# Patient Record
Sex: Male | Born: 1990 | Race: White | Hispanic: No | Marital: Single | Smoking: Current every day smoker
Health system: Southern US, Community
[De-identification: ages and names within clinical notes are randomized; demographics above are authoritative.]

## PROBLEM LIST (undated history)

## (undated) DIAGNOSIS — I10 Essential (primary) hypertension: Secondary | ICD-10-CM

## (undated) DIAGNOSIS — N200 Calculus of kidney: Secondary | ICD-10-CM

## (undated) HISTORY — PX: TONSILLECTOMY: SUR1361

---
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
## 2012-01-25 ENCOUNTER — Emergency Department (HOSPITAL_COMMUNITY)
Admission: EM | Admit: 2012-01-25 | Discharge: 2012-01-25 | Disposition: A | Discharge status: Home / Self Care | Payer: Commercial Managed Care - PPO | Attending: Emergency Medicine | Admitting: Emergency Medicine

## 2012-01-25 ENCOUNTER — Encounter (HOSPITAL_COMMUNITY): Payer: Self-pay | Admitting: Physical Medicine and Rehabilitation

## 2012-01-25 DIAGNOSIS — N200 Calculus of kidney: Secondary | ICD-10-CM

## 2012-01-25 HISTORY — DX: Calculus of kidney: N20.0

## 2012-01-25 LAB — URINALYSIS, ROUTINE W REFLEX MICROSCOPIC
Bilirubin Urine: NEGATIVE
Glucose, UA: NEGATIVE mg/dL
Specific Gravity, Urine: 1.025 (ref 1.005–1.030)

## 2012-01-25 LAB — BASIC METABOLIC PANEL
BUN: 12 mg/dL (ref 6–23)
Calcium: 10.4 mg/dL (ref 8.4–10.5)
GFR calc Af Amer: >90 mL/min (ref >90)
GFR calc non Af Amer: >90 mL/min (ref >90)
Potassium: 3.8 mEq/L (ref 3.5–5.1)
Sodium: 139 mEq/L (ref 135–145)

## 2012-01-25 LAB — CBC
HCT: 47.4 % (ref 39.0–52.0)
MCH: 29.7 pg (ref 26.0–34.0)
MCHC: 35.4 g/dL (ref 30.0–36.0)
RDW: 12.8 % (ref 11.5–15.5)

## 2012-01-25 LAB — URINE MICROSCOPIC-ADD ON

## 2012-01-25 MED ORDER — ONDANSETRON HCL 4 MG PO TABS: 4.0000 mg | ORAL_TABLET | Freq: Four times a day (QID) | ORAL | Status: AC | PRN

## 2012-01-25 MED ORDER — IBUPROFEN 800 MG PO TABS: 800.0000 mg | ORAL_TABLET | Freq: Three times a day (TID) | ORAL | Status: AC

## 2012-01-25 MED ORDER — KETOROLAC TROMETHAMINE 30 MG/ML IJ SOLN
30.0000 mg | Freq: Once | INTRAMUSCULAR | Status: AC
  Administered 2012-01-25: 30 mg via INTRAVENOUS
  Filled 2012-01-25: qty 1

## 2012-01-25 MED ORDER — SODIUM CHLORIDE 0.9 % IV BOLUS (SEPSIS)
1000.0000 mL | Freq: Once | INTRAVENOUS | Status: AC
  Administered 2012-01-25: 1000 mL via INTRAVENOUS

## 2012-01-25 MED ORDER — ONDANSETRON HCL 4 MG/2ML IJ SOLN
4.0000 mg | Freq: Once | INTRAMUSCULAR | Status: AC
  Administered 2012-01-25: 4 mg via INTRAVENOUS
  Filled 2012-01-25: qty 2

## 2012-01-25 MED ORDER — OXYCODONE-ACETAMINOPHEN 5-325 MG PO TABS: 1.0000 | ORAL_TABLET | Freq: Four times a day (QID) | ORAL | Status: AC | PRN

## 2012-01-25 MED ORDER — ONDANSETRON 4 MG PO TBDP
ORAL_TABLET | ORAL | Status: AC
  Administered 2012-01-25: 8 mg via ORAL
  Filled 2012-01-25: qty 2

## 2012-01-25 MED ORDER — HYDROMORPHONE HCL PF 1 MG/ML IJ SOLN
1.0000 mg | Freq: Once | INTRAMUSCULAR | Status: AC
  Administered 2012-01-25: 1 mg via INTRAVENOUS
  Filled 2012-01-25: qty 1

## 2012-01-25 MED ORDER — TAMSULOSIN HCL 0.4 MG PO CAPS: 0.4000 mg | ORAL_CAPSULE | Freq: Every day | ORAL | Status: DC

## 2012-01-25 MED ORDER — ONDANSETRON 4 MG PO TBDP
8.0000 mg | ORAL_TABLET | Freq: Once | ORAL | Status: AC
  Administered 2012-01-25: 8 mg via ORAL

## 2012-01-25 NOTE — Discharge Instructions (Signed)
As we discussed, your labs showed no problem with kidney function and no elevated white blood cell count. Your urine showed blood and some dehydration without evidence of a urinary infection. Please take the prescriptions as discussed/directed.     Kidney Stones Kidney stones (ureteral lithiasis) are deposits that form inside your kidneys. The intense pain is caused by the stone moving through the urinary tract. When the stone moves, the ureter goes into spasm around the stone. The stone is usually passed in the urine.  CAUSES   A disorder that makes certain neck glands produce too much parathyroid hormone (primary hyperparathyroidism).   A buildup of uric acid crystals.   Narrowing (stricture) of the ureter.   A kidney obstruction present at birth (congenital obstruction).   Previous surgery on the kidney or ureters.   Numerous kidney infections.  SYMPTOMS   Feeling sick to your stomach (nauseous).   Throwing up (vomiting).   Blood in the urine (hematuria).   Pain that usually spreads (radiates) to the groin.   Frequency or urgency of urination.  DIAGNOSIS   Taking a history and physical exam.   Blood or urine tests.   Computerized X-ray scan (CT scan).   Occasionally, an examination of the inside of the urinary bladder (cystoscopy) is performed.  TREATMENT   Observation.   Increasing your fluid intake.   Surgery may be needed if you have severe pain or persistent obstruction.  The size, location, and chemical composition are all important variables that will determine the proper choice of action for you. Talk to your caregiver to better understand your situation so that you will minimize the risk of injury to yourself and your kidney.  HOME CARE INSTRUCTIONS   Drink enough water and fluids to keep your urine clear or pale yellow.   Strain all urine through the provided strainer. Keep all particulate matter and stones for your caregiver to see. The stone causing  the pain may be as small as a grain of salt. It is very important to use the strainer each and every time you pass your urine. The collection of your stone will allow your caregiver to analyze it and verify that a stone has actually passed.   Only take over-the-counter or prescription medicines for pain, discomfort, or fever as directed by your caregiver.   Make a follow-up appointment with your caregiver as directed.   Get follow-up X-rays if required. The absence of pain does not always mean that the stone has passed. It may have only stopped moving. If the urine remains completely obstructed, it can cause loss of kidney function or even complete destruction of the kidney. It is your responsibility to make sure X-rays and follow-ups are completed. Ultrasounds of the kidney can show blockages and the status of the kidney. Ultrasounds are not associated with any radiation and can be performed easily in a matter of minutes.  SEEK IMMEDIATE MEDICAL CARE IF:   Pain cannot be controlled with the prescribed medicine.   You have a fever.   The severity or intensity of pain increases over 18 hours and is not relieved by pain medicine.   You develop a new onset of abdominal pain.   You feel faint or pass out.  MAKE SURE YOU:   Understand these instructions.   Will watch your condition.   Will get help right away if you are not doing well or get worse.  Document Released: 07/29/2005 Document Revised: 07/18/2011 Document Reviewed: 11/24/2009 ExitCare Patient Information  [address] [place].

## 2012-01-25 NOTE — ED Notes (Signed)
Called lab and they will run BMP STAT

## 2012-01-25 NOTE — ED Provider Notes (Signed)
Medical screening examination/treatment/procedure(s) were performed by non-physician practitioner and as supervising physician I was immediately available for consultation/collaboration.  Ethelda Chick, MD 01/25/12 [phone]

## 2012-01-25 NOTE — ED Notes (Signed)
Spoke with minilab, blood for istat chem 8 was accidentally sent upstairs with other lab work. PA updated and pt and mother at bedside updated on delay. PA will order BMP for main lab.

## 2012-01-25 NOTE — ED Notes (Signed)
Pt presents to department for evaluation of L sided flank pain, onset this morning. Also states dysuria and dark colored urine. 8/10 pain. Also states nausea/vomiting. He is alert and oriented x4. States history of kidney stones.

## 2012-01-25 NOTE — ED Provider Notes (Signed)
History     CSN: [id number]  Arrival date & time 01/25/12  1208   First MD Initiated Contact with Patient 01/25/12 1230      Chief Complaint  Patient presents with  . Flank Pain  . Dysuria    (Consider location/radiation/quality/duration/timing/severity/associated sxs/prior treatment) The history is provided by the patient and a parent.   the patient is a 21 year old male with a known history of kidney stones as diagnosed on CT scan approximately one month ago per his mother, who presents the emergency department with a chief complaint of left flank pain gradually worsening for the last 2 days. Pain colicky, severe, radiates to left lower abdomen in the left groin. Associated with nausea and vomiting as well as decreased urine output/difficulty urinating today. + gross hematuria, mild dysuria. He denies any fever, but does endorse chills and sweats. No aggravating or alleviating factors. Has no remaining pain medication at home from his prior evaluation. Per mother, patient was initially seen by urologist in [place] but was unhappy with care and is currently obtaining followup with a urologist at [facility]- confident an appt can be scheduled this week.  Past Medical History  Diagnosis Date  . Kidney stones     No past surgical history on file.  History reviewed. No pertinent family history.  History  Substance Use Topics  . Smoking status: Never Smoker   . Smokeless tobacco: Not on file  . Alcohol Use: No      Review of Systems 10 systems reviewed and are negative for acute change except as noted in the HPI.  Allergies  Review of patient's allergies indicates no known allergies.  Home Medications  No current outpatient prescriptions on file.  BP 146/93  Pulse 84  Temp 98.7 F (37.1 C) (Oral)  Resp 18  SpO2 100%  Physical Exam  Constitutional: He appears well-developed and well-nourished.       VS reviewed, sig for slight HTN. Lying on stretcher, uncomfortable  appearing  HENT:  Head: Normocephalic and atraumatic.       MMM  Eyes: Pupils are equal, round, and reactive to light.  Neck: Neck supple.  Cardiovascular: Normal rate, regular rhythm and normal heart sounds.   Pulmonary/Chest: Effort normal and breath sounds normal. No respiratory distress. He has no wheezes. He exhibits no tenderness.  Abdominal: Soft. Bowel sounds are normal. He exhibits no distension. There is tenderness (mild LLQ, suprapubic).  Musculoskeletal: He exhibits no edema.  Neurological: He is alert.       Speech clear. MAEW.  Skin: Skin is warm and dry. There is pallor.    ED Course  Procedures (including critical care time)  Labs Reviewed  URINALYSIS, ROUTINE W REFLEX MICROSCOPIC - Abnormal; Notable for the following:    APPearance CLOUDY (*)     Hgb urine dipstick MODERATE (*)     Ketones, ur 15 (*)     Protein, ur 100 (*)     Leukocytes, UA TRACE (*)     All other components within normal limits  URINE MICROSCOPIC-ADD ON - Abnormal; Notable for the following:    Squamous Epithelial / LPF FEW (*)     All other components within normal limits  CBC   No results found.   1. Kidney stones       MDM  1:15 PM Pt seen and evaluated. Initial H&P completed. Given recent CT scan with known multiple stones, we will assess renal function, hydrate, and treat pain with plan to d/c home  with urology follow-up.      3:43 PM Improved pain, preserved renal function. No leukocytosis or evidence of UTI. VSS. Urinated without trouble in ED. Will d./c home with pain/nausea rx and flomax, agrees to f/u ASAP with urology at [facility].  Shaaron Adler, PA-C 01/25/12 1547

## 2012-01-25 NOTE — ED Notes (Signed)
Pt and mother aware BMP has resulted, PA notified

## 2018-12-15 ENCOUNTER — Other Ambulatory Visit: Payer: Self-pay

## 2018-12-15 ENCOUNTER — Emergency Department (HOSPITAL_COMMUNITY)
Admission: EM | Admit: 2018-12-15 | Discharge: 2018-12-15 | Disposition: A | Discharge status: Home / Self Care | Payer: 59 | Attending: Emergency Medicine | Admitting: Emergency Medicine

## 2018-12-15 ENCOUNTER — Emergency Department (HOSPITAL_COMMUNITY): Payer: 59

## 2018-12-15 DIAGNOSIS — R0602 Shortness of breath: Secondary | ICD-10-CM | POA: Insufficient documentation

## 2018-12-15 DIAGNOSIS — R0789 Other chest pain: Secondary | ICD-10-CM | POA: Diagnosis present

## 2018-12-15 LAB — CBC
HCT: 45 % (ref 39.0–52.0)
Hemoglobin: 15.1 g/dL (ref 13.0–17.0)
MCH: 30 pg (ref 26.0–34.0)
MCHC: 33.6 g/dL (ref 30.0–36.0)
MCV: 89.5 fL (ref 80.0–100.0)
Platelets: 284 10*3/uL (ref 150–400)
RBC: 5.03 MIL/uL (ref 4.22–5.81)
RDW: 12.8 % (ref 11.5–15.5)
WBC: 12.6 10*3/uL — ABNORMAL HIGH (ref 4.0–10.5)
nRBC: 0 % (ref 0.0–0.2)

## 2018-12-15 LAB — TROPONIN I
Troponin I: <0.03 ng/mL (ref <0.03)
Troponin I: <0.03 ng/mL (ref <0.03)

## 2018-12-15 LAB — BASIC METABOLIC PANEL
Anion gap: 12 (ref 5–15)
BUN: 15 mg/dL (ref 6–20)
CO2: 22 mmol/L (ref 22–32)
Calcium: 9.5 mg/dL (ref 8.9–10.3)
Chloride: 103 mmol/L (ref 98–111)
Creatinine, Ser: 1.22 mg/dL (ref 0.61–1.24)
GFR calc Af Amer: >60 mL/min (ref >60)
GFR calc non Af Amer: >60 mL/min (ref >60)
Glucose, Bld: 160 mg/dL — ABNORMAL HIGH (ref 70–99)
Potassium: 3.6 mmol/L (ref 3.5–5.1)
Sodium: 137 mmol/L (ref 135–145)

## 2018-12-15 LAB — D-DIMER, QUANTITATIVE: D-Dimer, Quant: <0.27 ug/mL-FEU (ref 0.00–0.50)

## 2018-12-15 MED ORDER — SODIUM CHLORIDE 0.9% FLUSH: 3.0000 mL | Freq: Once | INTRAVENOUS | Status: DC

## 2018-12-15 NOTE — ED Notes (Signed)
Pt discharged from ED; instructions provided and scripts given; Pt encouraged to return to ED if symptoms worsen and to f/u with PCP; Pt verbalized understanding of all instructions 

## 2018-12-15 NOTE — ED Triage Notes (Signed)
Per pt he has been having chest pain with sob for about 1 week. NO fevers, no chills. Pt has had no vomiting, does not radiate anywhere.

## 2018-12-15 NOTE — ED Provider Notes (Signed)
[facility] EMERGENCY DEPARTMENT Provider Note   CSN: [id number] Arrival date & time: 12/15/18  0220    History   Chief Complaint Chief Complaint  Patient presents with   Chest Pain   Shortness of Breath    HPI Randy Norris is a 28 y.o. male.     Patient presents with left-sided chest pain that has been constant for about the past 1 hour.  States this is been constant and came on while he was at home playing video games.  The pain is in his left upper chest feels more like a tightness rather than actual pain.  Does radiate to his left arm.  There is some associated shortness of breath.  No nausea, vomiting, fever, chills, diaphoresis.  Patient reports having this pain intermittently over the past 2 weeks happening about once a day lasting for 30 to 60 minutes at a time.  He has not had the pain evaluated before tonight.  Reports this is the worst episode he had.  He did not take any for it at home.  Denies any cardiac history or early history of cardiac disease in his family.  Denies taking any medications on a regular basis.  No history of alcohol or drug abuse.  No cocaine abuse.  Patient does state he has a history of generalized anxiety disorder and panic attacks but does not take any medication for this.  He is feeling better since arrival and his chest pain is resolving.  It is not worse with palpation or movement.   Chest Pain  Associated symptoms: shortness of breath   Associated symptoms: no abdominal pain, no dizziness, no fever, no headache, no nausea, no vomiting and no weakness   Shortness of Breath  Associated symptoms: chest pain   Associated symptoms: no abdominal pain, no fever, no headaches, no rash and no vomiting     Past Medical History:  Diagnosis Date   Kidney stones     Patient Active Problem List   Diagnosis Date Noted   ANKLE SPRAIN 12/28/2007   CONJUNCTIVITIS 06/24/2007   BRONCHITIS, ACUTE 05/06/2007   ELEVATED BLOOD  PRESSURE 04/29/2007    * The histories are not reviewed yet. Please review them in the "History" navigator section and refresh this SmartLink.      Home Medications    Prior to Admission medications   Medication Sig Start Date End Date Taking? Authorizing Provider  Tamsulosin HCl (FLOMAX) 0.4 MG CAPS Take 1 capsule (0.4 mg total) by mouth daily. Patient not taking: Reported on 12/15/2018 01/25/12   Lorenz Coaster, PA-C    Family History No family history on file.  Social History Social History   Tobacco Use   Smoking status: Never Smoker  Substance Use Topics   Alcohol use: No   Drug use: No     Allergies   Patient has no known allergies.   Review of Systems Review of Systems  Constitutional: Negative for activity change, appetite change and fever.  HENT: Negative for congestion and rhinorrhea.   Eyes: Negative for visual disturbance.  Respiratory: Positive for shortness of breath.   Cardiovascular: Positive for chest pain.  Gastrointestinal: Negative for abdominal pain, nausea and vomiting.  Genitourinary: Negative for dysuria and hematuria.  Musculoskeletal: Negative for arthralgias and myalgias.  Skin: Negative for rash.  Neurological: Negative for dizziness, weakness and headaches.    all other systems are negative except as noted in the HPI and PMH.    Physical Exam Updated  Vital Signs BP (!) 162/89 (BP Location: Right Arm)    Pulse 92    Temp 97.7 F (36.5 C) (Oral)    Resp 16    SpO2 100%   Physical Exam Vitals signs and nursing note reviewed.  Constitutional:      General: He is not in acute distress.    Appearance: He is well-developed.     Comments: Appears anxious, no distress  HENT:     Head: Normocephalic and atraumatic.     Mouth/Throat:     Pharynx: No oropharyngeal exudate.  Eyes:     Conjunctiva/sclera: Conjunctivae normal.     Pupils: Pupils are equal, round, and reactive to light.  Neck:     Musculoskeletal: Normal range of  motion and neck supple.     Comments: No meningismus. Cardiovascular:     Rate and Rhythm: Normal rate and regular rhythm.     Heart sounds: Normal heart sounds. No murmur.  Pulmonary:     Effort: Pulmonary effort is normal. No respiratory distress.     Breath sounds: Normal breath sounds.     Comments: Chest wall is nontender to palpation.  No pain with left arm movement Chest:     Chest wall: No tenderness.  Abdominal:     Palpations: Abdomen is soft.     Tenderness: There is no abdominal tenderness. There is no guarding or rebound.  Musculoskeletal: Normal range of motion.        General: No tenderness.  Skin:    General: Skin is warm.     Capillary Refill: Capillary refill takes less than 2 seconds.  Neurological:     General: No focal deficit present.     Mental Status: He is alert and oriented to person, place, and time. Mental status is at baseline.     Cranial Nerves: No cranial nerve deficit.     Motor: No abnormal muscle tone.     Coordination: Coordination normal.     Comments: No ataxia on finger to nose bilaterally. No pronator drift. 5/5 strength throughout. CN 2-12 intact.Equal grip strength. Sensation intact.   Psychiatric:        Behavior: Behavior normal.      ED Treatments / Results  Labs (all labs ordered are listed, but only abnormal results are displayed) Labs Reviewed  BASIC METABOLIC PANEL - Abnormal; Notable for the following components:      Result Value   Glucose, Bld 160 (*)    All other components within normal limits  CBC - Abnormal; Notable for the following components:   WBC 12.6 (*)    All other components within normal limits  TROPONIN I  D-DIMER, QUANTITATIVE (NOT AT [facility])  TROPONIN I    EKG EKG Interpretation  Date/Time:  Tuesday Dec 15 2018 02:28:55 EDT Ventricular Rate:  90 PR Interval:  130 QRS Duration: 94 QT Interval:  344 QTC Calculation: 420 R Axis:   67 Text Interpretation:  Normal sinus rhythm with sinus arrhythmia  Normal ECG No previous ECGs available Confirmed by Glynn Octave [id number]) on 12/15/2018 2:49:16 AM   Radiology Dg Chest 2 View  Result Date: 12/15/2018 CLINICAL DATA:  Chest pain, shortness of breath EXAM: CHEST - 2 VIEW COMPARISON:  None. FINDINGS: Heart and mediastinal contours are within normal limits. No focal opacities or effusions. No acute bony abnormality. IMPRESSION: No active cardiopulmonary disease. Electronically Signed   By: Charlett Nose M.D.   On: 12/15/2018 02:51    Procedures Procedures (including critical  care time)  Medications Ordered in ED Medications  sodium chloride flush (NS) 0.9 % injection 3 mL (has no administration in time range)     Initial Impression / Assessment and Plan / ED Course  I have reviewed the triage vital signs and the nursing notes.  Pertinent labs & imaging results that were available during my care of the patient were reviewed by me and considered in my medical decision making (see chart for details).       Chest tightness and shortness of breath ongoing for the past hour.  History of recurrent episodes over the past several weeks.  Pain is not necessarily exertional or pleuritic.  Suspect anxiety component.  Low suspicion for ACS or PE.  Chest x-ray negative, troponin negative and d-dimer is negative.  Chest and chest pain has improved without intervention in the ED. Discussed that patient is low risk for ACS but not zero risk and may benefit from outpatient stress test.  Suspect anxiety component to his chest pain with low suspicion for ACS or PE.  Discussed outpatient follow-up for stress test and follow-up with PCP for treatment of his anxiety. Troponin negative x2.  No further episodes of chest pain.  Return precautions discussed.  Final Clinical Impressions(s) / ED Diagnoses   Final diagnoses:  Atypical chest pain    ED Discharge Orders    None       Devion Chriscoe, Jeannett SeniorStephen, MD 12/15/18 [phone]

## 2018-12-15 NOTE — Discharge Instructions (Addendum)
There is no evidence of heart attack or blood clot in the lung.  Follow-up with your doctor who may set you up for a stress test.  Return to the ED if your chest pain becomes exertional, associated with shortness of breath, nausea, vomiting, sweating or any other concerns.

## 2019-11-12 IMAGING — CR CHEST - 2 VIEW
2 series · 2 of 2 positions shown · non-contrast
Comparison: None.

CLINICAL DATA: Chest pain, shortness of breath

EXAM:
CHEST - 2 VIEW

[chest pa]
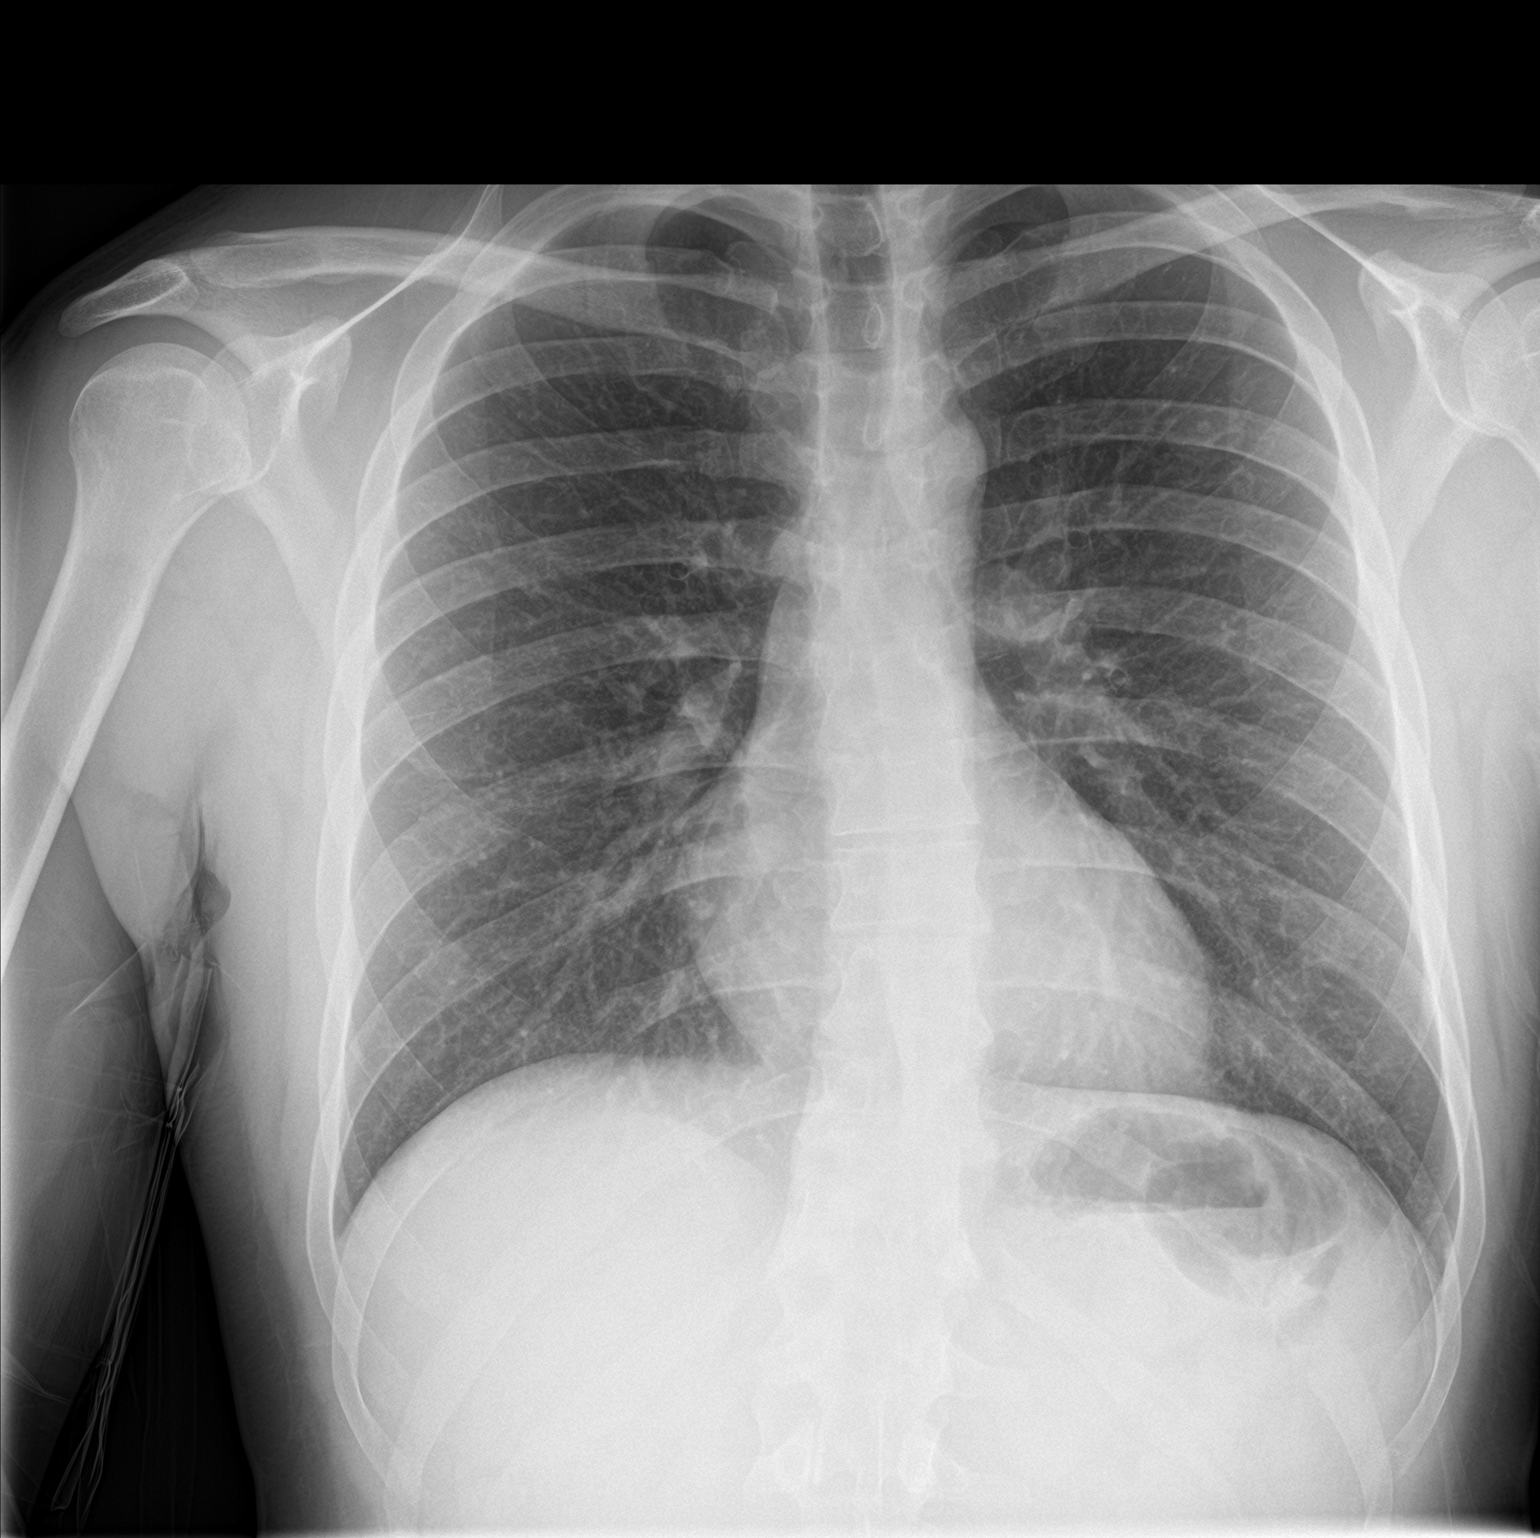

[chest lat]
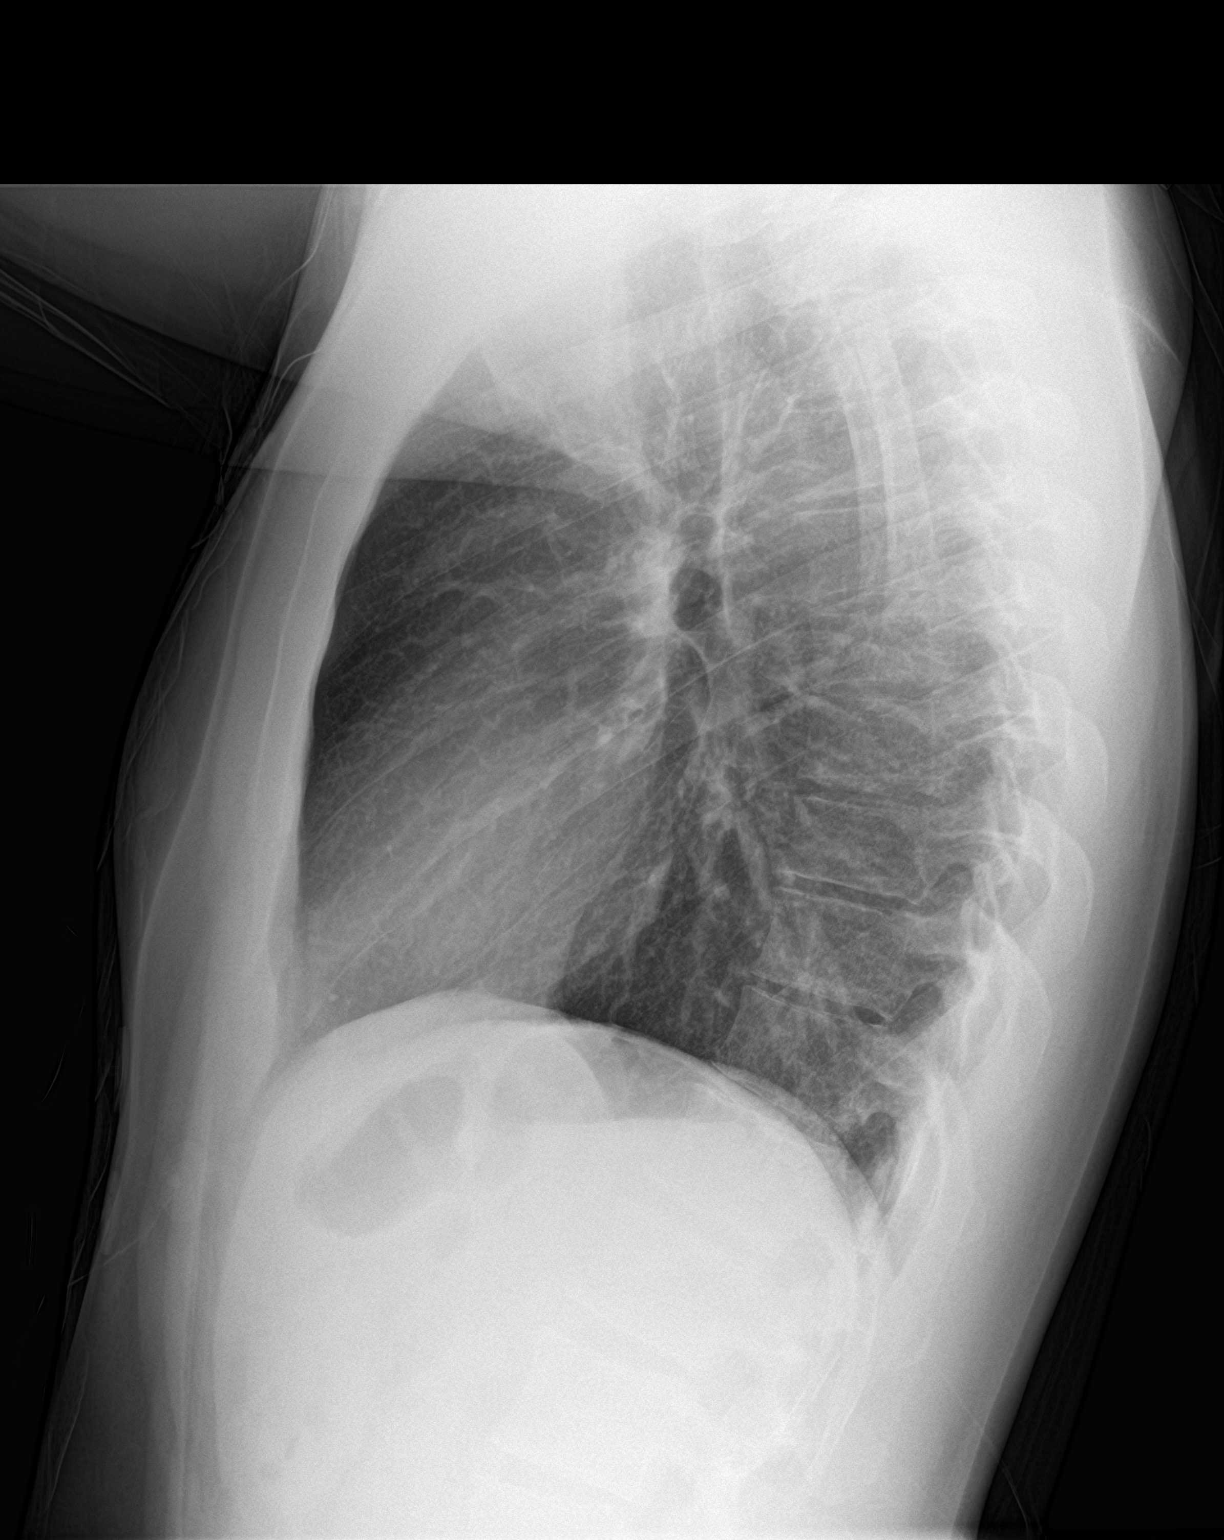

[2 of 2 positions shown; findings below may reference images not displayed]

FINDINGS: Heart and mediastinal contours are within normal limits. No focal
opacities or effusions. No acute bony abnormality.
IMPRESSION: No active cardiopulmonary disease.

## 2021-01-16 ENCOUNTER — Ambulatory Visit: Payer: Self-pay | Admitting: Physician Assistant

## 2021-01-16 ENCOUNTER — Other Ambulatory Visit: Payer: Self-pay

## 2021-01-16 VITALS — BP 129/79 | HR 80 | Temp 98.2°F | Resp 18 | Ht 69.0 in | Wt 208.0 lb

## 2021-01-16 DIAGNOSIS — Z1159 Encounter for screening for other viral diseases: Secondary | ICD-10-CM

## 2021-01-16 DIAGNOSIS — F411 Generalized anxiety disorder: Secondary | ICD-10-CM

## 2021-01-16 DIAGNOSIS — F1110 Opioid abuse, uncomplicated: Secondary | ICD-10-CM

## 2021-01-16 DIAGNOSIS — Z114 Encounter for screening for human immunodeficiency virus [HIV]: Secondary | ICD-10-CM

## 2021-01-16 DIAGNOSIS — R7309 Other abnormal glucose: Secondary | ICD-10-CM

## 2021-01-16 DIAGNOSIS — I10 Essential (primary) hypertension: Secondary | ICD-10-CM

## 2021-01-16 LAB — POCT GLYCOSYLATED HEMOGLOBIN (HGB A1C): Hemoglobin A1C: 5.3 % (ref 4.0–5.6)

## 2021-01-16 MED ORDER — METOPROLOL TARTRATE 50 MG PO TABS: 50.0000 mg | ORAL_TABLET | Freq: Two times a day (BID) | ORAL | 1 refills | Status: DC

## 2021-01-16 MED ORDER — BUSPIRONE HCL 10 MG PO TABS: 10.0000 mg | ORAL_TABLET | Freq: Three times a day (TID) | ORAL | 1 refills | Status: DC

## 2021-01-16 NOTE — Patient Instructions (Signed)
For your hypertension, you will continue taking metoprolol 50 mg twice a day.  I do encourage you to continue checking your blood pressure on a daily basis, keeping a written log and having available for office visits.  I encourage you to follow a low-sodium diet.  For your anxiety, I encourage you to continue taking BuSpar 10 mg 3 times a day.  Please return to the mobile unit after completion of your program so we can help you obtain [place] financial assistance and help you obtain a primary care provider to help manage your medications.  We will call you with your lab results.  Roney Jaffe, PA-C Physician Assistant [facility] [email or web address]    Low-Sodium Eating Plan Sodium, which is an element that makes up salt, helps you maintain a healthy balance of fluids in your body. Too much sodium can increase your blood pressure and cause fluid and waste to be held in your body. Your health care provider or dietitian may recommend following this plan if you have high blood pressure (hypertension), kidney disease, liver disease, or heart failure. Eating less sodium can help lower your blood pressure, reduce swelling, and protect your heart, liver, and kidneys. What are tips for following this plan? Reading food labels  The Nutrition Facts label lists the amount of sodium in one serving of the food. If you eat more than one serving, you must multiply the listed amount of sodium by the number of servings.  Choose foods with less than 140 mg of sodium per serving.  Avoid foods with 300 mg of sodium or more per serving. Shopping  Look for lower-sodium products, often labeled as "low-sodium" or "no salt added."  Always check the sodium content, even if foods are labeled as "unsalted" or "no salt added."  Buy fresh foods. ? Avoid canned foods and pre-made or frozen meals. ? Avoid canned, cured, or processed meats.  Buy breads  that have less than 80 mg of sodium per slice.   Cooking  Eat more home-cooked food and less restaurant, buffet, and fast food.  Avoid adding salt when cooking. Use salt-free seasonings or herbs instead of table salt or sea salt. Check with your health care provider or pharmacist before using salt substitutes.  Cook with plant-based oils, such as canola, sunflower, or olive oil.   Meal planning  When eating at a restaurant, ask that your food be prepared with less salt or no salt, if possible. Avoid dishes labeled as brined, pickled, cured, smoked, or made with soy sauce, miso, or teriyaki sauce.  Avoid foods that contain MSG (monosodium glutamate). MSG is sometimes added to [place] food, bouillon, and some canned foods.  Make meals that can be grilled, baked, poached, roasted, or steamed. These are generally made with less sodium. General information Most people on this plan should limit their sodium intake to 1,500-2,000 mg (milligrams) of sodium each day. What foods should I eat? Fruits Fresh, frozen, or canned fruit. Fruit juice. Vegetables Fresh or frozen vegetables. "No salt added" canned vegetables. "No salt added" tomato sauce and paste. Low-sodium or reduced-sodium tomato and vegetable juice. Grains Low-sodium cereals, including oats, puffed wheat and rice, and shredded wheat. Low-sodium crackers. Unsalted rice. Unsalted pasta. Low-sodium bread. Whole-grain breads and whole-grain pasta. Meats and other proteins Fresh or frozen (no salt added) meat, poultry, seafood, and fish. Low-sodium canned tuna and salmon. Unsalted nuts. Dried peas, beans, and lentils without added salt. Unsalted canned beans. Eggs. Unsalted nut butters. Dairy  Milk. Soy milk. Cheese that is naturally low in sodium, such as ricotta cheese, fresh mozzarella, or Swiss cheese. Low-sodium or reduced-sodium cheese. Cream cheese. Yogurt. Seasonings and condiments Fresh and dried herbs and spices. Salt-free  seasonings. Low-sodium mustard and ketchup. Sodium-free salad dressing. Sodium-free light mayonnaise. Fresh or refrigerated horseradish. Lemon juice. Vinegar. Other foods Homemade, reduced-sodium, or low-sodium soups. Unsalted popcorn and pretzels. Low-salt or salt-free chips. The items listed above may not be a complete list of foods and beverages you can eat. Contact a dietitian for more information. What foods should I avoid? Vegetables Sauerkraut, pickled vegetables, and relishes. Olives. [place] fries. Onion rings. Regular canned vegetables (not low-sodium or reduced-sodium). Regular canned tomato sauce and paste (not low-sodium or reduced-sodium). Regular tomato and vegetable juice (not low-sodium or reduced-sodium). Frozen vegetables in sauces. Grains Instant hot cereals. Bread stuffing, pancake, and biscuit mixes. Croutons. Seasoned rice or pasta mixes. Noodle soup cups. Boxed or frozen macaroni and cheese. Regular salted crackers. Self-rising flour. Meats and other proteins Meat or fish that is salted, canned, smoked, spiced, or pickled. Precooked or cured meat, such as sausages or meat loaves. Tomasa Blase. Ham. Pepperoni. Hot dogs. Corned beef. Chipped beef. Salt pork. Jerky. Pickled herring. Anchovies and sardines. Regular canned tuna. Salted nuts. Dairy Processed cheese and cheese spreads. Hard cheeses. Cheese curds. Blue cheese. Feta cheese. String cheese. Regular cottage cheese. Buttermilk. Canned milk. Fats and oils Salted butter. Regular margarine. Ghee. Bacon fat. Seasonings and condiments Onion salt, garlic salt, seasoned salt, table salt, and sea salt. Canned and packaged gravies. Worcestershire sauce. Tartar sauce. Barbecue sauce. Teriyaki sauce. Soy sauce, including reduced-sodium. Steak sauce. Fish sauce. Oyster sauce. Cocktail sauce. Horseradish that you find on the shelf. Regular ketchup and mustard. Meat flavorings and tenderizers. Bouillon cubes. Hot sauce. Pre-made or packaged  marinades. Pre-made or packaged taco seasonings. Relishes. Regular salad dressings. Salsa. Other foods Salted popcorn and pretzels. Corn chips and puffs. Potato and tortilla chips. Canned or dried soups. Pizza. Frozen entrees and pot pies. The items listed above may not be a complete list of foods and beverages you should avoid. Contact a dietitian for more information. Summary  Eating less sodium can help lower your blood pressure, reduce swelling, and protect your heart, liver, and kidneys.  Most people on this plan should limit their sodium intake to 1,500-2,000 mg (milligrams) of sodium each day.  Canned, boxed, and frozen foods are high in sodium. Restaurant foods, fast foods, and pizza are also very high in sodium. You also get sodium by adding salt to food.  Try to cook at home, eat more fresh fruits and vegetables, and eat less fast food and canned, processed, or prepared foods. This information is not intended to replace advice given to you by your health care provider. Make sure you discuss any questions you have with your health care provider. Document Revised: 09/03/2019 Document Reviewed: 06/30/2019 Elsevier Patient Education  2021 [employer].

## 2021-01-16 NOTE — Progress Notes (Signed)
Patient has taken medication today and patient has eaten today. Patient denies pain at this time.  

## 2021-01-16 NOTE — Progress Notes (Signed)
New Patient Office Visit  Subjective:  Patient ID: Randy Norris, male    DOB: 19-May-1991  Age: 30 y.o. MRN: [id number]  CC:  Chief Complaint  Patient presents with  . Medication Refill    BP    HPI Randy Norris reports that he has been taking blood pressure medication for the past 3 to 4 years.  Reports that he previously did not have a good diet, states now that he is working on sobriety, his diet has improved and he is working on being more compliant.  Does endorse history of opioid abuse, states that he is currently in a recovery treatment program residential at [facility].  Reports blood pressure readings have been within normal limits.  Reports that he is taking BuSpar 10 mg 3 times a day with relief of anxiety.  Sleep is good appetite is good.  No other complaints at this time.   Past Medical History:  Diagnosis Date  . Kidney stones     History reviewed. No pertinent surgical history.  History reviewed. No pertinent family history.  Social History   Socioeconomic History  . Marital status: Single    Spouse name: Not on file  . Number of children: Not on file  . Years of education: Not on file  . Highest education level: Not on file  Occupational History  . Not on file  Tobacco Use  . Smoking status: Never Smoker  . Smokeless tobacco: Never Used  Substance and Sexual Activity  . Alcohol use: No  . Drug use: No  . Sexual activity: Yes  Other Topics Concern  . Not on file  Social History Narrative  . Not on file   Social Determinants of Health   Financial Resource Strain: Not on file  Food Insecurity: Not on file  Transportation Needs: Not on file  Physical Activity: Not on file  Stress: Not on file  Social Connections: Not on file  Intimate Partner Violence: Not on file    ROS Review of Systems  Constitutional: Negative for chills and fever.  HENT: Negative.   Eyes: Negative.   Respiratory: Negative for shortness of breath.    Cardiovascular: Negative for chest pain.  Gastrointestinal: Negative.   Endocrine: Negative.   Genitourinary: Negative.   Musculoskeletal: Negative.   Skin: Negative.   Allergic/Immunologic: Negative.   Neurological: Negative for headaches.  Hematological: Negative.   Psychiatric/Behavioral: Negative for dysphoric mood, self-injury, sleep disturbance and suicidal ideas. The patient is not nervous/anxious.     Objective:   Today's Vitals: BP 129/79 (BP Location: Left Arm, Patient Position: Sitting, Cuff Size: Large)   Pulse 80   Temp 98.2 F (36.8 C) (Oral)   Resp 18   Ht 5\' 9"  (1.753 m)   Wt 208 lb (94.3 kg)   SpO2 100%   BMI 30.72 kg/m   Physical Exam Vitals and nursing note reviewed.  Constitutional:      Appearance: Normal appearance.  HENT:     Head: Normocephalic and atraumatic.     Right Ear: External ear normal.     Left Ear: External ear normal.     Nose: Nose normal.     Mouth/Throat:     Mouth: Mucous membranes are moist.     Pharynx: Oropharynx is clear.  Eyes:     Extraocular Movements: Extraocular movements intact.     Conjunctiva/sclera: Conjunctivae normal.     Pupils: Pupils are equal, round, and reactive to light.  Cardiovascular:  Rate and Rhythm: Normal rate and regular rhythm.     Pulses: Normal pulses.     Heart sounds: Normal heart sounds.  Pulmonary:     Effort: Pulmonary effort is normal.     Breath sounds: Normal breath sounds.  Musculoskeletal:        General: Normal range of motion.     Cervical back: Normal range of motion and neck supple.  Skin:    General: Skin is warm and dry.  Neurological:     General: No focal deficit present.     Mental Status: He is alert and oriented to person, place, and time.  Psychiatric:        Mood and Affect: Mood normal.        Behavior: Behavior normal.        Thought Content: Thought content normal.        Judgment: Judgment normal.     Assessment & Plan:   Problem List Items  Addressed This Visit      Cardiovascular and Mediastinum   Essential hypertension - Primary   Relevant Medications   metoprolol tartrate (LOPRESSOR) 50 MG tablet   Other Relevant Orders   CBC with Differential/Platelet (Completed)   Comp. Metabolic Panel (12) (Completed)   TSH (Completed)     Other   Opioid abuse (HCC) history   GAD (generalized anxiety disorder)   Relevant Medications   busPIRone (BUSPAR) 10 MG tablet   Other Relevant Orders   Vitamin D, 25-hydroxy (Completed)   Elevated random blood glucose level   Relevant Orders   HgB A1c (Completed)    Other Visit Diagnoses    Screening for HIV (human immunodeficiency virus)       Relevant Orders   HIV antibody (with reflex) (Completed)   Encounter for HCV screening test for low risk patient       Relevant Orders   HCV Ab w Reflex to Quant PCR (Completed)      Outpatient Encounter Medications as of 01/16/2021  Medication Sig  . [DISCONTINUED] busPIRone (BUSPAR) 10 MG tablet Take 10 mg by mouth 3 (three) times daily.  . [DISCONTINUED] metoprolol tartrate (LOPRESSOR) 50 MG tablet Take 50 mg by mouth 2 (two) times daily.  . busPIRone (BUSPAR) 10 MG tablet Take 1 tablet (10 mg total) by mouth 3 (three) times daily.  . metoprolol tartrate (LOPRESSOR) 50 MG tablet Take 1 tablet (50 mg total) by mouth 2 (two) times daily.  . [DISCONTINUED] Tamsulosin HCl (FLOMAX) 0.4 MG CAPS Take 1 capsule (0.4 mg total) by mouth daily. (Patient not taking: No sig reported)   No facility-administered encounter medications on file as of 01/16/2021.  1. Essential hypertension Continue current regimen.  Patient encouraged to check blood pressure on a daily basis, keep a written log and have available for all office visits.  Patient education given on low-sodium diet.  Patient is currently at [facility] residential substance abuse treatment program.  Patient encouraged to return to the mobile unit upon completion of program to help with [place]  financial assistance and to help obtain a primary care provider.  Red flags given for prompt reevaluation. - metoprolol tartrate (LOPRESSOR) 50 MG tablet; Take 1 tablet (50 mg total) by mouth 2 (two) times daily.  Dispense: 60 tablet; Refill: 1 - CBC with Differential/Platelet - Comp. Metabolic Panel (12) - TSH  2. GAD (generalized anxiety disorder) Continue current regimen - busPIRone (BUSPAR) 10 MG tablet; Take 1 tablet (10 mg total) by mouth 3 (three)  times daily.  Dispense: 90 tablet; Refill: 1 - Vitamin D, 25-hydroxy  3. Opioid abuse (HCC) history   4. Elevated random blood glucose level A1C 5.3 - HgB A1c  5. Screening for HIV (human immunodeficiency virus)  - HIV antibody (with reflex)  6. Encounter for HCV screening test for low risk patient  - HCV Ab w Reflex to Quant PCR   I have reviewed the patient's medical history (PMH, PSH, Social History, Family History, Medications, and allergies) , and have been updated if relevant. I spent 32 minutes reviewing chart and  face to face time with patient.     Follow-up: Return in about 2 weeks (around 01/30/2021).   Kasandra Knudsen Mayers, PA-C

## 2021-01-17 DIAGNOSIS — F1110 Opioid abuse, uncomplicated: Secondary | ICD-10-CM | POA: Insufficient documentation

## 2021-01-17 DIAGNOSIS — R7309 Other abnormal glucose: Secondary | ICD-10-CM | POA: Insufficient documentation

## 2021-01-17 DIAGNOSIS — I10 Essential (primary) hypertension: Secondary | ICD-10-CM | POA: Insufficient documentation

## 2021-01-17 DIAGNOSIS — F411 Generalized anxiety disorder: Secondary | ICD-10-CM | POA: Insufficient documentation

## 2021-01-17 LAB — CBC WITH DIFFERENTIAL/PLATELET
Basophils Absolute: 0.1 10*3/uL (ref 0.0–0.2)
Basos: 1 %
EOS (ABSOLUTE): 0.2 10*3/uL (ref 0.0–0.4)
Eos: 2 %
Hematocrit: 44.9 % (ref 37.5–51.0)
Hemoglobin: 15 g/dL (ref 13.0–17.7)
Immature Grans (Abs): 0 10*3/uL (ref 0.0–0.1)
Immature Granulocytes: 0 %
Lymphocytes Absolute: 2.3 10*3/uL (ref 0.7–3.1)
Lymphs: 26 %
MCH: 29.4 pg (ref 26.6–33.0)
MCHC: 33.4 g/dL (ref 31.5–35.7)
MCV: 88 fL (ref 79–97)
Monocytes Absolute: 0.6 10*3/uL (ref 0.1–0.9)
Monocytes: 7 %
Neutrophils Absolute: 5.8 10*3/uL (ref 1.4–7.0)
Neutrophils: 64 %
Platelets: 254 10*3/uL (ref 150–450)
RBC: 5.11 x10E6/uL (ref 4.14–5.80)
RDW: 14.3 % (ref 11.6–15.4)
WBC: 9.1 10*3/uL (ref 3.4–10.8)

## 2021-01-17 LAB — COMP. METABOLIC PANEL (12)
AST: 26 IU/L (ref 0–40)
Albumin/Globulin Ratio: 2.1 (ref 1.2–2.2)
Albumin: 4.7 g/dL (ref 4.1–5.2)
Alkaline Phosphatase: 76 IU/L (ref 44–121)
BUN/Creatinine Ratio: 15 (ref 9–20)
BUN: 16 mg/dL (ref 6–20)
Bilirubin Total: 0.6 mg/dL (ref 0.0–1.2)
Calcium: 9.8 mg/dL (ref 8.7–10.2)
Chloride: 101 mmol/L (ref 96–106)
Creatinine, Ser: 1.09 mg/dL (ref 0.76–1.27)
Globulin, Total: 2.2 g/dL (ref 1.5–4.5)
Glucose: 96 mg/dL (ref 65–99)
Potassium: 4.3 mmol/L (ref 3.5–5.2)
Sodium: 142 mmol/L (ref 134–144)
Total Protein: 6.9 g/dL (ref 6.0–8.5)
eGFR: 94 mL/min/{1.73_m2} (ref >59)

## 2021-01-17 LAB — HCV INTERPRETATION

## 2021-01-17 LAB — HIV ANTIBODY (ROUTINE TESTING W REFLEX): HIV Screen 4th Generation wRfx: NONREACTIVE

## 2021-01-17 LAB — VITAMIN D 25 HYDROXY (VIT D DEFICIENCY, FRACTURES): Vit D, 25-Hydroxy: 20.6 ng/mL — ABNORMAL LOW (ref 30.0–100.0)

## 2021-01-17 LAB — HCV AB W REFLEX TO QUANT PCR: HCV Ab: <0.1 s/co ratio (ref 0.0–0.9)

## 2021-01-17 LAB — TSH: TSH: 2.73 u[IU]/mL (ref 0.450–4.500)

## 2021-01-24 ENCOUNTER — Telehealth: Payer: Self-pay | Admitting: *Deleted

## 2021-01-24 NOTE — Telephone Encounter (Signed)
-----   Message from Roney Jaffe, [place] sent at 01/17/2021  2:03 PM EDT ----- Please call patient and let him know that his thyroid function, kidney and liver function are within normal limits.  He does not show signs of anemia.  His screening for hepatitis C and HIV were negative.  His vitamin D is low.  He needs to take 2000 international units once a day and have this level rechecked in approximately 12 weeks.  He will purchase this over-the-counter.

## 2021-01-24 NOTE — Telephone Encounter (Signed)
MA UTR patient via both numbers with a woman answering the home number sharing she had not seen or heard from the patient in "awhile" MA LM sharing to return a phone call to [facility] at [phone] if she is able to reach the individual.

## 2021-01-29 ENCOUNTER — Ambulatory Visit: Payer: Self-pay | Admitting: Physician Assistant

## 2021-01-29 ENCOUNTER — Other Ambulatory Visit: Payer: Self-pay

## 2021-01-29 VITALS — BP 123/81 | HR 84 | Temp 98.7°F | Resp 18 | Ht 69.0 in | Wt 213.0 lb

## 2021-01-29 DIAGNOSIS — I10 Essential (primary) hypertension: Secondary | ICD-10-CM

## 2021-01-29 DIAGNOSIS — E559 Vitamin D deficiency, unspecified: Secondary | ICD-10-CM

## 2021-01-29 DIAGNOSIS — F1110 Opioid abuse, uncomplicated: Secondary | ICD-10-CM

## 2021-01-29 DIAGNOSIS — F411 Generalized anxiety disorder: Secondary | ICD-10-CM

## 2021-01-29 NOTE — Patient Instructions (Signed)
You have a refill available of your medications at [facility].  For your vitamin D deficiency, you will take 2000 international units once a day.  You will purchase this over-the-counter.  Please feel free to return to the mobile unit for follow-up, we will be able to help you work on obtaining  financial assistance and a primary care provider.  Please let us know if there is anything else we can do for you  Roney Jaffe, PA-C Physician Assistant [facility] [email or web address]   Vitamin D Deficiency Vitamin D deficiency is when your body does not have enough vitamin D. Vitamin D is important to your body for many reasons: It helps the body absorb two important minerals--calcium and phosphorus. It plays a role in bone health. It may help to prevent some diseases, such as diabetes and multiple sclerosis. It plays a role in muscle function, including heart function. If vitamin D deficiency is severe, it can cause a condition in which your bones become soft. In adults, this condition is called osteomalacia. In children,this condition is called rickets. What are the causes? This condition may be caused by: Not eating enough foods that contain vitamin D. Not getting enough natural sun exposure. Having certain digestive system diseases that make it difficult for your body to absorb vitamin D. These diseases include Crohn's disease, chronic pancreatitis, and cystic fibrosis. Having a surgery in which a part of the stomach or a part of the small intestine is removed. Having chronic kidney disease or liver disease. What increases the risk? You are more likely to develop this condition if you: Are older. Do not spend much time outdoors. Live in a long-term care facility. Have had broken bones. Have weak or thin bones (osteoporosis). Have a disease or condition that changes how the body absorbs vitamin D. Have dark  skin. Take certain medicines, such as steroid medicines or certain seizure medicines. Are overweight or obese. What are the signs or symptoms? In mild cases of vitamin D deficiency, there may not be any symptoms. If the condition is severe, symptoms may include: Bone pain. Muscle pain. Falling often. Broken bones caused by a minor injury. How is this diagnosed? This condition may be diagnosed with blood tests. Imaging tests such as [place] also be done to look for changes in the bone. How is this treated? Treatment for this condition may depend on what caused the condition. Treatment options include: Taking vitamin D supplements. Your health care provider will suggest what dose is best for you. Taking a calcium supplement. Your health care provider will suggest what dose is best for you. Follow these instructions at home: Eating and drinking  Eat foods that contain vitamin D. Choices include: Fortified dairy products, cereals, or juices. Fortified means that vitamin D has been added to the food. Check the label on the package to see if the food is fortified. Fatty fish, such as salmon or trout. Eggs. Oysters. Mushrooms. The items listed above may not be a complete list of recommended foods and beverages. Contact a dietitian for more information. General instructions Take medicines and supplements only as told by your health care provider. Get regular, safe exposure to natural sunlight. Do not use a tanning bed. Maintain a healthy weight. Lose weight if needed. Keep all follow-up visits as told by your health care provider. This is important. How is this prevented? You can get vitamin D by: Eating foods that naturally contain vitamin D. Eating or drinking products  that have been fortified with vitamin D, such as cereals, juices, and dairy products (including milk). Taking a vitamin D supplement or a multivitamin supplement that contains vitamin D. Being in the sun. Your body  naturally makes vitamin D when your skin is exposed to sunlight. Your body changes the sunlight into a form of the vitamin that it can use. Contact a health care provider if: Your symptoms do not go away. You feel nauseous or you vomit. You have fewer bowel movements than usual or are constipated. Summary Vitamin D deficiency is when your body does not have enough vitamin D. Vitamin D is important to your body for good bone health and muscle function, and it may help prevent some diseases. Vitamin D deficiency is primarily treated through supplementation. Your health care provider will suggest what dose is best for you. You can get vitamin D by eating foods that contain vitamin D, by being in the sun, and by taking a vitamin D supplement or a multivitamin supplement that contains vitamin D. This information is not intended to replace advice given to you by your health care provider. Make sure you discuss any questions you have with your healthcare provider. Document Revised: 04/06/2018 Document Reviewed: 04/06/2018 Elsevier Patient Education  2022 [employer].

## 2021-01-29 NOTE — Progress Notes (Signed)
Patient has eaten today. Patient has taken medication today Patient denies pain at this time. Patient received labs during visit.

## 2021-01-29 NOTE — Progress Notes (Signed)
Patient received labs verbally in person and a printed copy during visit today.

## 2021-01-29 NOTE — Progress Notes (Signed)
Established Patient Office Visit  Subjective:  Patient ID: Randy Norris, male    DOB: February 06, 1991  Age: 30 y.o. MRN: [id number]  CC:  Chief Complaint  Patient presents with   Hypertension     HPI Randy Norris reports that he has been checking his blood pressure, states that his blood pressure readings have remained within normal limits.  Reports that he has continued his BuSpar 10 mg 3 times a day, states that it is offering relief from his anxiety.  Reports that he will be leaving the residential substance abuse treatment program and going to [facility] in [place].      Past Medical History:  Diagnosis Date   Kidney stones     History reviewed. No pertinent surgical history.  History reviewed. No pertinent family history.  Social History   Socioeconomic History   Marital status: Single    Spouse name: Not on file   Number of children: Not on file   Years of education: Not on file   Highest education level: Not on file  Occupational History   Not on file  Tobacco Use   Smoking status: Never   Smokeless tobacco: Never  Substance and Sexual Activity   Alcohol use: No   Drug use: No   Sexual activity: Yes  Other Topics Concern   Not on file  Social History Narrative   Not on file   Social Determinants of Health   Financial Resource Strain: Not on file  Food Insecurity: Not on file  Transportation Needs: Not on file  Physical Activity: Not on file  Stress: Not on file  Social Connections: Not on file  Intimate Partner Violence: Not on file    Outpatient Medications Prior to Visit  Medication Sig Dispense Refill   busPIRone (BUSPAR) 10 MG tablet Take 1 tablet (10 mg total) by mouth 3 (three) times daily. 90 tablet 1   metoprolol tartrate (LOPRESSOR) 50 MG tablet Take 1 tablet (50 mg total) by mouth 2 (two) times daily. 60 tablet 1   No facility-administered medications prior to visit.    No Known Allergies  ROS Review of Systems   Constitutional: Negative.   HENT: Negative.    Eyes: Negative.   Respiratory:  Negative for shortness of breath.   Cardiovascular:  Negative for chest pain.  Endocrine: Negative.   Genitourinary: Negative.   Musculoskeletal: Negative.   Skin: Negative.   Allergic/Immunologic: Negative.   Neurological: Negative.   Hematological: Negative.   Psychiatric/Behavioral:  Negative for dysphoric mood, self-injury, sleep disturbance and suicidal ideas. The patient is not nervous/anxious.      Objective:    Physical Exam Vitals and nursing note reviewed.  Constitutional:      Appearance: Normal appearance.  HENT:     Head: Normocephalic and atraumatic.     Right Ear: External ear normal.     Left Ear: External ear normal.     Nose: Nose normal.     Mouth/Throat:     Mouth: Mucous membranes are moist.     Pharynx: Oropharynx is clear.  Eyes:     Extraocular Movements: Extraocular movements intact.     Conjunctiva/sclera: Conjunctivae normal.     Pupils: Pupils are equal, round, and reactive to light.  Cardiovascular:     Rate and Rhythm: Normal rate and regular rhythm.     Pulses: Normal pulses.     Heart sounds: Normal heart sounds.  Pulmonary:     Effort: Pulmonary effort is  normal.     Breath sounds: Normal breath sounds.  Musculoskeletal:        General: Normal range of motion.     Cervical back: Normal range of motion and neck supple.  Skin:    General: Skin is warm and dry.  Neurological:     General: No focal deficit present.     Mental Status: He is alert and oriented to person, place, and time.  Psychiatric:        Mood and Affect: Mood normal.        Behavior: Behavior normal.        Thought Content: Thought content normal.        Judgment: Judgment normal.    BP 123/81 (BP Location: Left Arm, Patient Position: Sitting, Cuff Size: Large)   Pulse 84   Temp 98.7 F (37.1 C) (Oral)   Resp 18   Ht $R'5\' 9"'Wt$  (1.753 m)   Wt 213 lb (96.6 kg)   SpO2 100%   BMI 31.45  kg/m  Wt Readings from Last 3 Encounters:  01/29/21 213 lb (96.6 kg)  01/16/21 208 lb (94.3 kg)  12/15/18 185 lb (83.9 kg)     Health Maintenance Due  Topic Date Due   COVID-19 Vaccine (1) Never done   TETANUS/TDAP  11/03/2010    There are no preventive care reminders to display for this patient.  Lab Results  Component Value Date   TSH 2.730 01/16/2021   Lab Results  Component Value Date   WBC 9.1 01/16/2021   HGB 15.0 01/16/2021   HCT 44.9 01/16/2021   MCV 88 01/16/2021   PLT 254 01/16/2021   Lab Results  Component Value Date   NA 142 01/16/2021   K 4.3 01/16/2021   CO2 22 12/15/2018   GLUCOSE 96 01/16/2021   BUN 16 01/16/2021   CREATININE 1.09 01/16/2021   BILITOT 0.6 01/16/2021   ALKPHOS 76 01/16/2021   AST 26 01/16/2021   ALT 23 08/27/2006   PROT 6.9 01/16/2021   ALBUMIN 4.7 01/16/2021   CALCIUM 9.8 01/16/2021   ANIONGAP 12 12/15/2018   EGFR 94 01/16/2021   No results found for: CHOL No results found for: HDL No results found for: LDLCALC No results found for: TRIG No results found for: CHOLHDL Lab Results  Component Value Date   HGBA1C 5.3 01/16/2021      Assessment & Plan:   Problem List Items Addressed This Visit       Cardiovascular and Mediastinum   Essential hypertension - Primary     Other   Opioid abuse ([place]) history   GAD (generalized anxiety disorder)   Other Visit Diagnoses     Vitamin D deficiency           No orders of the defined types were placed in this encounter.  1. Essential hypertension Continue current regimen.  Patient informed of refill available, patient encouraged to return to mobile unit after arriving at [facility] in order to obtain application for [facility] financial assistance and assistance in obtaining a primary care provider.  Patient understands and agrees.  Patient encouraged to continue checking blood pressure on a daily basis, keeping a written log and have it available for all office  visits.  2. GAD (generalized anxiety disorder) Continue current regimen  3. Vitamin D deficiency Patient education given, encouraged over-the-counter supplementation  4. Opioid abuse ([facility]) history Continuing with substance abuse treatment program at this time   I have reviewed the patient's  medical history (PMH, PSH, Social History, Family History, Medications, and allergies) , and have been updated if relevant. I spent 23 minutes reviewing chart and  face to face time with patient.    Follow-up: Return if symptoms worsen or fail to improve.    Loraine Grip Mayers, PA-C

## 2021-01-30 DIAGNOSIS — E559 Vitamin D deficiency, unspecified: Secondary | ICD-10-CM | POA: Insufficient documentation

## 2021-02-06 ENCOUNTER — Other Ambulatory Visit: Payer: Self-pay

## 2021-02-06 ENCOUNTER — Ambulatory Visit (HOSPITAL_COMMUNITY): Payer: No Payment, Other | Admitting: Behavioral Health

## 2021-05-08 ENCOUNTER — Emergency Department (HOSPITAL_BASED_OUTPATIENT_CLINIC_OR_DEPARTMENT_OTHER)
Admission: EM | Admit: 2021-05-08 | Discharge: 2021-05-08 | Disposition: A | Discharge status: Home / Self Care | Payer: Self-pay | Attending: Emergency Medicine | Admitting: Emergency Medicine

## 2021-05-08 ENCOUNTER — Other Ambulatory Visit: Payer: Self-pay

## 2021-05-08 ENCOUNTER — Encounter (HOSPITAL_BASED_OUTPATIENT_CLINIC_OR_DEPARTMENT_OTHER): Payer: Self-pay | Admitting: Emergency Medicine

## 2021-05-08 ENCOUNTER — Emergency Department (HOSPITAL_BASED_OUTPATIENT_CLINIC_OR_DEPARTMENT_OTHER): Payer: Self-pay | Admitting: Radiology

## 2021-05-08 DIAGNOSIS — R002 Palpitations: Secondary | ICD-10-CM

## 2021-05-08 DIAGNOSIS — I10 Essential (primary) hypertension: Secondary | ICD-10-CM | POA: Insufficient documentation

## 2021-05-08 DIAGNOSIS — F411 Generalized anxiety disorder: Secondary | ICD-10-CM | POA: Insufficient documentation

## 2021-05-08 DIAGNOSIS — Z79899 Other long term (current) drug therapy: Secondary | ICD-10-CM | POA: Insufficient documentation

## 2021-05-08 DIAGNOSIS — F17210 Nicotine dependence, cigarettes, uncomplicated: Secondary | ICD-10-CM | POA: Insufficient documentation

## 2021-05-08 HISTORY — DX: Essential (primary) hypertension: I10

## 2021-05-08 LAB — CBC
HCT: 50.2 % (ref 39.0–52.0)
Hemoglobin: 17.1 g/dL — ABNORMAL HIGH (ref 13.0–17.0)
MCH: 29.1 pg (ref 26.0–34.0)
MCHC: 34.1 g/dL (ref 30.0–36.0)
MCV: 85.5 fL (ref 80.0–100.0)
Platelets: 361 10*3/uL (ref 150–400)
RBC: 5.87 MIL/uL — ABNORMAL HIGH (ref 4.22–5.81)
RDW: 12.9 % (ref 11.5–15.5)
WBC: 13.1 10*3/uL — ABNORMAL HIGH (ref 4.0–10.5)
nRBC: 0 % (ref 0.0–0.2)

## 2021-05-08 LAB — BASIC METABOLIC PANEL
Anion gap: 13 (ref 5–15)
BUN: 17 mg/dL (ref 6–20)
CO2: 22 mmol/L (ref 22–32)
Calcium: 10.6 mg/dL — ABNORMAL HIGH (ref 8.9–10.3)
Chloride: 103 mmol/L (ref 98–111)
Creatinine, Ser: 0.95 mg/dL (ref 0.61–1.24)
GFR, Estimated: >60 mL/min (ref >60)
Glucose, Bld: 104 mg/dL — ABNORMAL HIGH (ref 70–99)
Potassium: 3.8 mmol/L (ref 3.5–5.1)
Sodium: 138 mmol/L (ref 135–145)

## 2021-05-08 LAB — TROPONIN I (HIGH SENSITIVITY): Troponin I (High Sensitivity): 2 ng/L (ref <18)

## 2021-05-08 MED ORDER — BUSPIRONE HCL 10 MG PO TABS: 10.0000 mg | ORAL_TABLET | Freq: Once | ORAL | Status: DC

## 2021-05-08 MED ORDER — METOPROLOL TARTRATE 50 MG PO TABS: 50.0000 mg | ORAL_TABLET | Freq: Two times a day (BID) | ORAL | 0 refills | Status: AC

## 2021-05-08 MED ORDER — SODIUM CHLORIDE 0.9 % IV BOLUS
1000.0000 mL | Freq: Once | INTRAVENOUS | Status: AC
  Administered 2021-05-08: 1000 mL via INTRAVENOUS

## 2021-05-08 MED ORDER — METOPROLOL TARTRATE 25 MG PO TABS
50.0000 mg | ORAL_TABLET | Freq: Once | ORAL | Status: AC
  Administered 2021-05-08: 50 mg via ORAL
  Filled 2021-05-08: qty 2

## 2021-05-08 MED ORDER — BUSPIRONE HCL 10 MG PO TABS: 10.0000 mg | ORAL_TABLET | Freq: Three times a day (TID) | ORAL | 0 refills | Status: AC

## 2021-05-08 NOTE — ED Provider Notes (Signed)
[facility] EMERGENCY DEPT Provider Note   CSN: [id number] Arrival date & time: 05/08/21  1202     History Chief Complaint  Patient presents with   Palpitations    Randy Norris is a 30 y.o. male with PMHx HTN and GAD who presents to the ED today from [facility] with complaint of gradual onset, constant, left sided chest pain/heart palpitations that began this AM around 7 AM. Pt also complains of dyspnea on exertion. No pleuritic chest pain. He reports he has had intermittent issues with chest pain, palpitations, and SOB for 2-3 years. He does admit that he has not been on his Metoprolol 50 mg BID or his Buspar 10 mg TID for at least 3 weeks each prior to going to [facility]. He last used benzos 3 weeks ago. Denies EtOH use. Pt is an everyday smoker. No hx DVT/PE. No recent prolonged travel or immobilization. No hemoptysis. No active malignancy. No exogenous hormone use.   The history is provided by the patient and medical records.   HPI: A 30 year old patient with a history of hypertension presents for evaluation of chest pain. Initial onset of pain was more than 6 hours ago. The patient's chest pain is described as heaviness/pressure/tightness and is not worse with exertion. The patient's chest pain is middle- or left-sided, is not well-localized, is not sharp and does not radiate to the arms/jaw/neck. The patient does not complain of nausea and denies diaphoresis. The patient has smoked in the past 90 days. The patient has no history of stroke, has no history of peripheral artery disease, denies any history of treated diabetes, has no relevant family history of coronary artery disease (first degree relative at less than age 61), has no history of hypercholesterolemia and does not have an elevated BMI (>=30).   Past Medical History:  Diagnosis Date   Hypertension    Kidney stones     Patient Active Problem List   Diagnosis Date Noted   Vitamin D deficiency 01/30/2021    Essential hypertension 01/17/2021   Opioid abuse ([facility]) history 01/17/2021   GAD (generalized anxiety disorder) 01/17/2021   Elevated random blood glucose level 01/17/2021    Past Surgical History:  Procedure Laterality Date   TONSILLECTOMY         History reviewed. No pertinent family history.  Social History   Tobacco Use   Smoking status: Every Day    Packs/day: 1.00    Years: 1.00    Pack years: 1.00    Types: Cigarettes   Smokeless tobacco: Current  Vaping Use   Vaping Use: Every day  Substance Use Topics   Alcohol use: No   Drug use: Yes    Comment: Benzos and Opiates    Home Medications Prior to Admission medications   Medication Sig Start Date End Date Taking? Authorizing Provider  busPIRone (BUSPAR) 10 MG tablet Take 1 tablet (10 mg total) by mouth 3 (three) times daily. 05/08/21   Hyman Hopes, Amairany Schumpert, PA-C  metoprolol tartrate (LOPRESSOR) 50 MG tablet Take 1 tablet (50 mg total) by mouth 2 (two) times daily. 05/08/21   Tanda Rockers, PA-C    Allergies    Patient has no known allergies.  Review of Systems   Review of Systems  Constitutional:  Negative for chills and fever.  Respiratory:  Positive for shortness of breath. Negative for cough.   Cardiovascular:  Positive for chest pain and palpitations. Negative for leg swelling.  Gastrointestinal:  Negative for nausea and vomiting.  All other  systems reviewed and are negative.  Physical Exam Updated Vital Signs BP (!) 148/101 (BP Location: Left Arm)   Pulse 94   Temp 98.6 F (37 C)   Resp 10   Ht 5\' 9"  (1.753 m)   Wt 87.5 kg   SpO2 100%   BMI 28.50 kg/m   Physical Exam Vitals and nursing note reviewed.  Constitutional:      Appearance: He is not ill-appearing or diaphoretic.  HENT:     Head: Normocephalic and atraumatic.  Eyes:     Conjunctiva/sclera: Conjunctivae normal.  Cardiovascular:     Rate and Rhythm: Regular rhythm. Tachycardia present.     Pulses: Normal pulses.  Pulmonary:      Effort: Pulmonary effort is normal.     Breath sounds: Normal breath sounds. No wheezing, rhonchi or rales.  Abdominal:     Palpations: Abdomen is soft.     Tenderness: There is no abdominal tenderness.  Musculoskeletal:     Cervical back: Neck supple.     Right lower leg: No edema.     Left lower leg: No edema.  Skin:    General: Skin is warm and dry.  Neurological:     Mental Status: He is alert.    ED Results / Procedures / Treatments   Labs (all labs ordered are listed, but only abnormal results are displayed) Labs Reviewed  BASIC METABOLIC PANEL - Abnormal; Notable for the following components:      Result Value   Glucose, Bld 104 (*)    Calcium 10.6 (*)    All other components within normal limits  CBC - Abnormal; Notable for the following components:   WBC 13.1 (*)    RBC 5.87 (*)    Hemoglobin 17.1 (*)    All other components within normal limits  TROPONIN I (HIGH SENSITIVITY)    EKG EKG Interpretation  Date/Time:  Tuesday May 08 2021 12:07:32 EDT Ventricular Rate:  123 PR Interval:  170 QRS Duration: 86 QT Interval:  294 QTC Calculation: 420 R Axis:   67 Text Interpretation: Sinus tachycardia Non-specific ST-t changes Confirmed by [id number] (Cathren Laine) on 05/08/2021 12:09:16 PM  Radiology DG Chest 2 View  Result Date: 05/08/2021 CLINICAL DATA:  Chest pain EXAM: CHEST - 2 VIEW COMPARISON:  None. FINDINGS: The cardiomediastinal silhouette is normal. There is no focal consolidation or pulmonary edema. There is no pleural effusion or pneumothorax. There is no acute osseous abnormality. IMPRESSION: No radiographic evidence of acute cardiopulmonary process. Electronically Signed   By: 05/10/2021 M.D.   On: 05/08/2021 12:59    Procedures Procedures   Medications Ordered in ED Medications  sodium chloride 0.9 % bolus 1,000 mL (1,000 mLs Intravenous New Bag/Given 05/08/21 1318)  metoprolol tartrate (LOPRESSOR) tablet 50 mg (50 mg Oral Given 05/08/21 1312)     ED Course  I have reviewed the triage vital signs and the nursing notes.  Pertinent labs & imaging results that were available during my care of the patient were reviewed by me and considered in my medical decision making (see chart for details).    MDM Rules/Calculators/A&P HEAR Score: 69                         30 year old male who presents to the ED today with complaint of palpitations, chest pain, shortness of breath that began this AM.  Has been off of his metoprolol and BuSpar for about 3 weeks secondary to being placed  at [facility] for recovery.  On arrival to the ED today  patient is tachycardic with a heart rate in the 110s.  His blood pressure is elevated 165/121.  EKG performed with sinus tachycardia and nonspecific ST changes.  We will plan for lab work at this time including chest x-ray, CBC, BMP, troponin.  We will plan for small amount of fluids and metoprolol as well as BuSpar. No risk factors concerning for PE.   Informed by ED pharmacist that we do not carry BuSpar here in the ED.  We will plan to discharge home with same.  Will provide metoprolol and reevaluate.  I, Jairy Angulo [employer], personally reviewed and evaluated these images and lab results as part of my medical decision-making.  CXR clear CBC with elevated WBC, RBC, and Hgb (13.1, 5.87, and 17.1). Question dehydration causing hemoconcentration?  Fluids given. Pt without infectious type symptoms today.  BMP with glucose 104 and calcium slightly elevated at 10.6. No other electrolyte abnormalities. Creatinine stable at 0.95.  Troponin 2. Heart score of 3. Do not feel pt requires repeat troponin testing.   Repeat blood pressure 148/102 after metoprolol. HR 97. Has not received fluids yet - will reevaluate after fluid resuscitation.   Repeat BP 139/101. Will discharge home at this time. Pt in agreement with plan and stable for discharge.   This note was prepared using Dragon voice recognition software and may  include unintentional dictation errors due to the inherent limitations of voice recognition software.   Final Clinical Impression(s) / ED Diagnoses Final diagnoses:  Heart palpitations  Primary hypertension  GAD (generalized anxiety disorder)  Essential hypertension    Rx / DC Orders ED Discharge Orders          Ordered    busPIRone (BUSPAR) 10 MG tablet  3 times daily        05/08/21 1355    metoprolol tartrate (LOPRESSOR) 50 MG tablet  2 times daily        05/08/21 1355             Discharge Instructions      Please take your medications as prescribed  Follow up with your PCP regarding ED visit today and for further evaluation if your heart palpitations continue  Return to the ED for any new/worsening symptoms       Tanda Rockers, PA-C 05/08/21 1356    Cathren Laine, MD 05/10/21 0021

## 2021-05-08 NOTE — ED Notes (Signed)
Patient verbalizes understanding of discharge instructions. Opportunity for questioning and answers were provided. Patient discharged from ED.  °

## 2021-05-08 NOTE — Discharge Instructions (Addendum)
Please take your medications as prescribed  Follow up with your PCP regarding ED visit today and for further evaluation if your heart palpitations continue  Return to the ED for any new/worsening symptoms

## 2021-05-08 NOTE — ED Triage Notes (Signed)
Pt arrives to ED from [facility] with concerns related to his blood pressure. Pt reports that he has had intermittent palpations for a least a year. Last drug use was 3 weeks ago.

## 2022-04-05 IMAGING — DX DG CHEST 2V
2 series · 2 of 2 positions shown · non-contrast
Comparison: None.

CLINICAL DATA: Chest pain

EXAM:
CHEST - 2 VIEW

[chest pa]
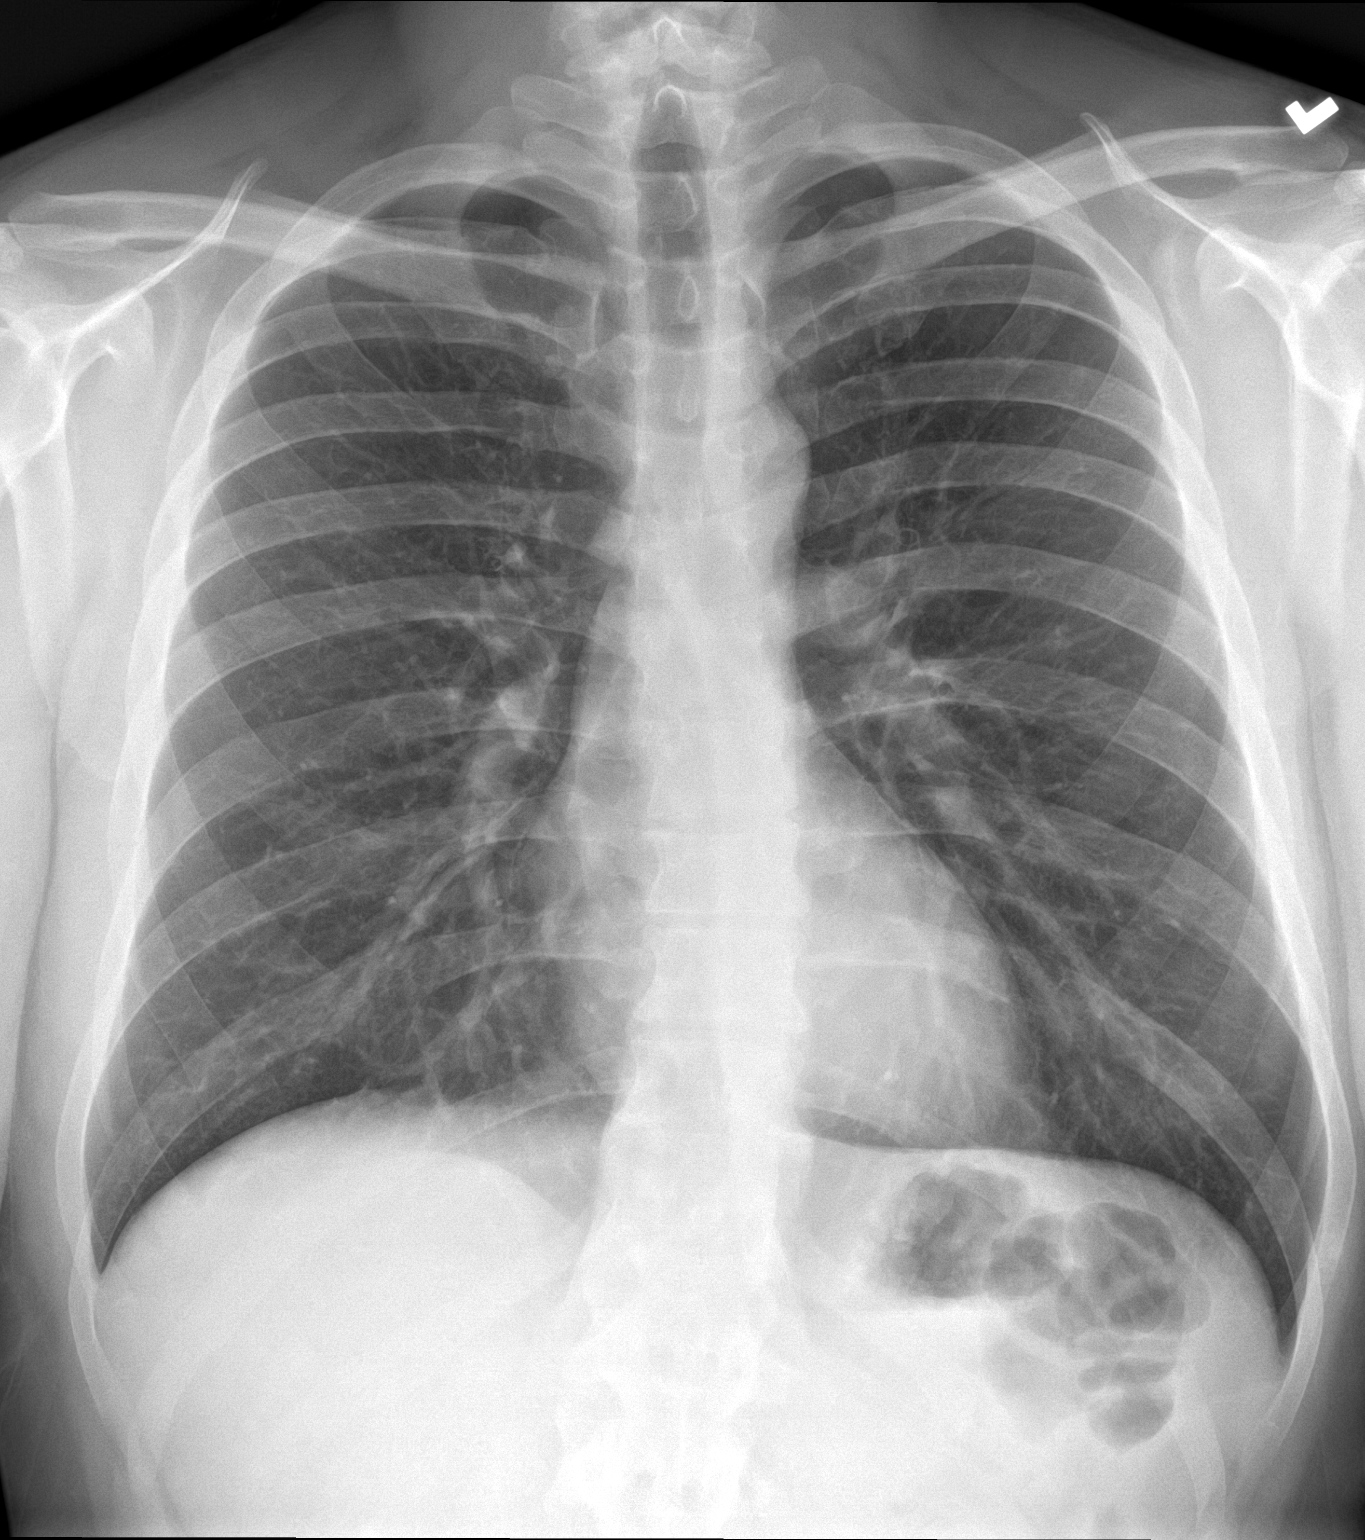

[chest lat]
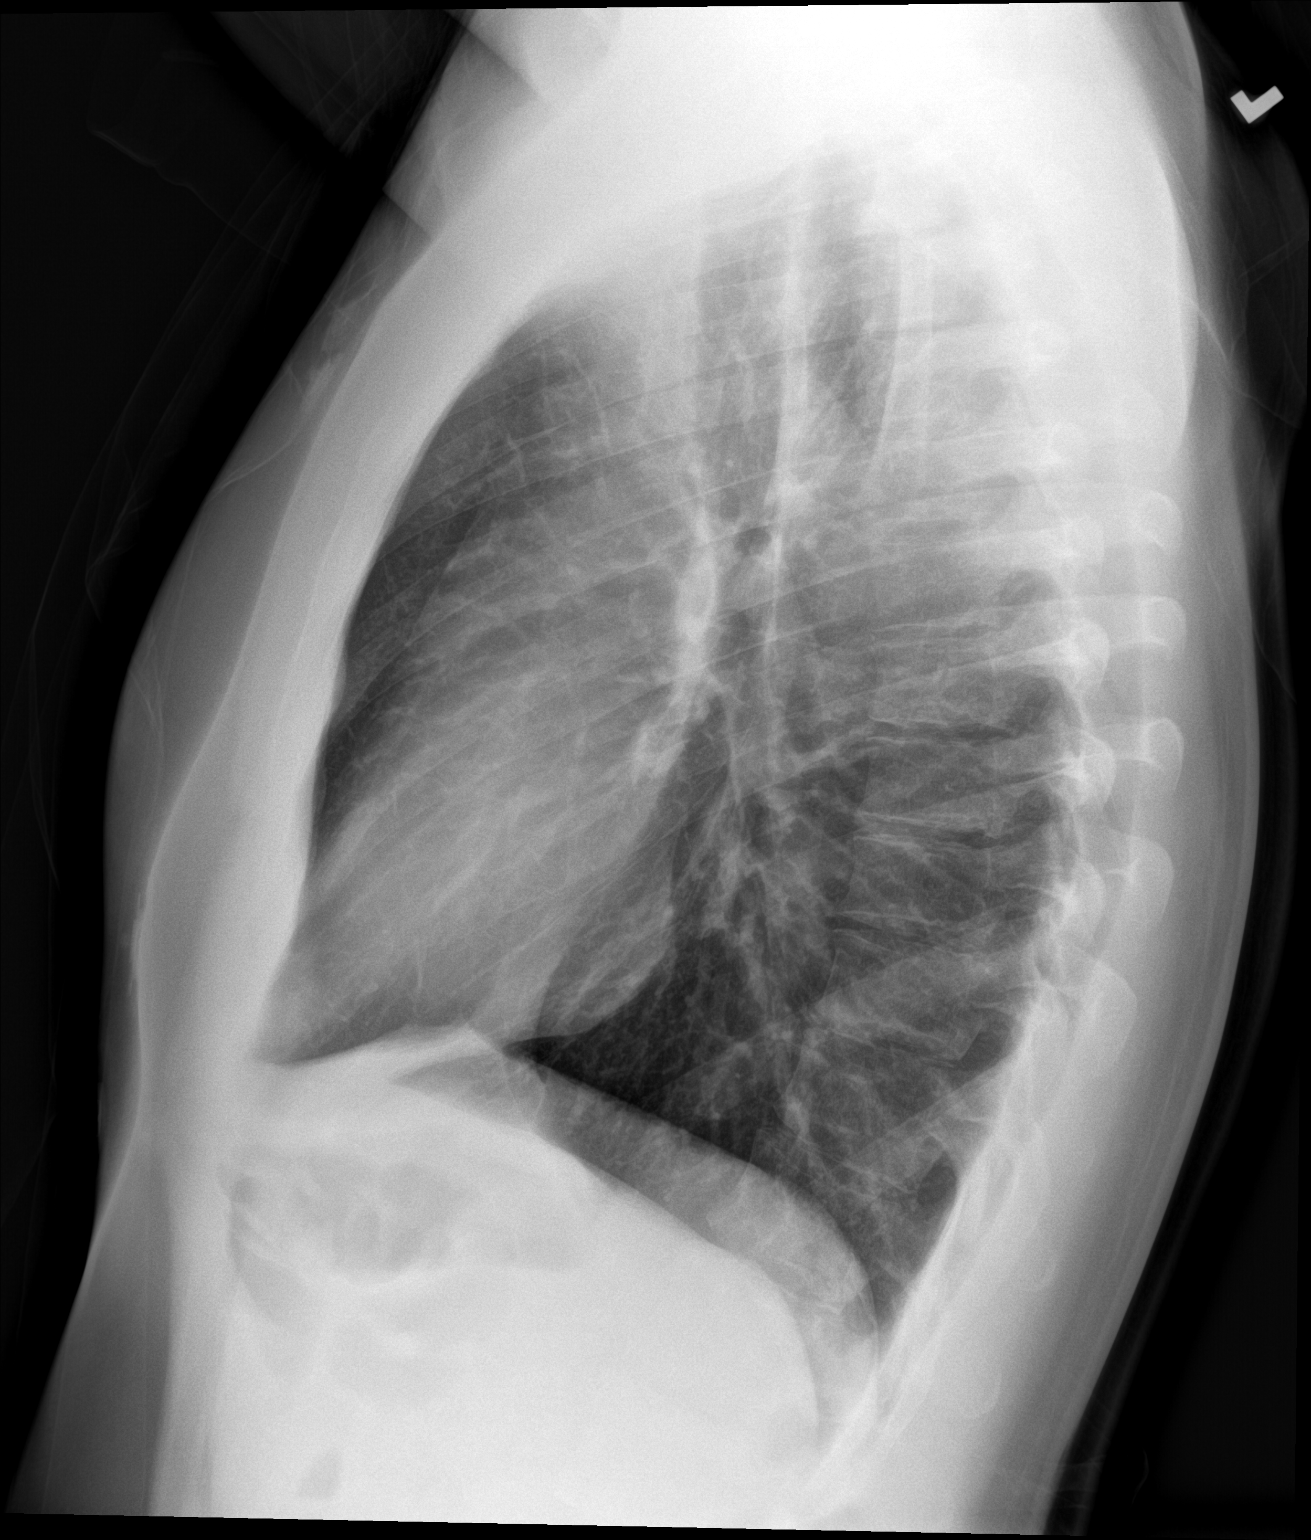

[2 of 2 positions shown; findings below may reference images not displayed]

FINDINGS: The cardiomediastinal silhouette is normal.

There is no focal consolidation or pulmonary edema. There is no
pleural effusion or pneumothorax.

There is no acute osseous abnormality.
IMPRESSION: No radiographic evidence of acute cardiopulmonary process.
